# Patient Record
Sex: Female | Born: 1955 | Hispanic: No | Marital: Married | Smoking: Former smoker
Health system: Southern US, Community
[De-identification: ages and names within clinical notes are randomized; demographics above are authoritative.]

## PROBLEM LIST (undated history)

## (undated) DIAGNOSIS — L409 Psoriasis, unspecified: Secondary | ICD-10-CM

## (undated) DIAGNOSIS — E7800 Pure hypercholesterolemia, unspecified: Secondary | ICD-10-CM

## (undated) DIAGNOSIS — R7303 Prediabetes: Secondary | ICD-10-CM

## (undated) HISTORY — DX: Psoriasis, unspecified: L40.9

## (undated) HISTORY — DX: Prediabetes: R73.03

## (undated) HISTORY — PX: OTHER SURGICAL HISTORY: SHX169

## (undated) HISTORY — DX: Pure hypercholesterolemia, unspecified: E78.00

---

## 2010-05-02 ENCOUNTER — Other Ambulatory Visit
Admission: RE | Admit: 2010-05-02 | Discharge: 2010-05-02 | Payer: Self-pay | Source: Home / Self Care | Admitting: Obstetrics and Gynecology

## 2010-05-11 ENCOUNTER — Ambulatory Visit: Payer: Self-pay

## 2011-05-14 ENCOUNTER — Ambulatory Visit (INDEPENDENT_AMBULATORY_CARE_PROVIDER_SITE_OTHER): Payer: PRIVATE HEALTH INSURANCE | Admitting: Cardiology

## 2011-05-14 ENCOUNTER — Encounter: Payer: Self-pay | Admitting: Cardiology

## 2011-05-14 VITALS — BP 111/75 | HR 85 | Resp 16 | Ht 64.0 in | Wt 187.0 lb

## 2011-05-14 DIAGNOSIS — I6529 Occlusion and stenosis of unspecified carotid artery: Secondary | ICD-10-CM

## 2011-05-14 DIAGNOSIS — M79606 Pain in leg, unspecified: Secondary | ICD-10-CM

## 2011-05-14 DIAGNOSIS — R7303 Prediabetes: Secondary | ICD-10-CM | POA: Insufficient documentation

## 2011-05-14 DIAGNOSIS — R7309 Other abnormal glucose: Secondary | ICD-10-CM

## 2011-05-14 DIAGNOSIS — E7800 Pure hypercholesterolemia, unspecified: Secondary | ICD-10-CM | POA: Insufficient documentation

## 2011-05-14 DIAGNOSIS — M79609 Pain in unspecified limb: Secondary | ICD-10-CM

## 2011-05-14 MED ORDER — PRAVASTATIN SODIUM 20 MG PO TABS: 20.0000 mg | ORAL_TABLET | Freq: Every evening | ORAL | Status: DC

## 2011-05-14 NOTE — Assessment & Plan Note (Signed)
She follows with [facility] for follow up of this.  She needs risk reduction.

## 2011-05-14 NOTE — Patient Instructions (Signed)
Please start Pravastatin 20 mg a day Continue all other medications as listed  Your physician has requested that you have an ankle brachial index (ABI). During this test an ultrasound and blood pressure cuff are used to evaluate the arteries that supply the arms and legs with blood. Allow thirty minutes for this exam. There are no restrictions or special instructions.

## 2011-05-14 NOTE — Progress Notes (Signed)
HPI The patient presents as a new patient for evaluation of leg pain. She has actually been getting discomfort in her feet for some time. She did have ABIs about a year ago with the right being 0.94 in the left 0.83. Her only other cardiovascular workup has been a negative stress to her she reports about 10 years ago. I do note that she also has carotid artery stenosis with 50-69% bilateral stenosis last documented in 2012.  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 She says this will happen to her routinely when she walks three quarters of a mile. She says her feet feel heavy. She doesn't describe calf discomfort or thigh pain. She doesn't have chest pressure, neck or arm discomfort. She doesn't describe any palpitations, presyncope or syncope. She has no significant shortness of breath, PND or orthopnea.  Allergies  Allergen Reactions  . Beta Adrenergic Blockers   . Erythromycin   . Lipitor (Atorvastatin Calcium)   . Penicillins     Current Outpatient Prescriptions  Medication Sig Dispense Refill  . aspirin 81 MG tablet Take 81 mg by mouth daily.      . Cholecalciferol (VITAMIN D) 2000 UNITS tablet Take 2,000 Units by mouth daily.      . Omega-3 Fatty Acids (FISH OIL PO) Take by mouth daily.      . pravastatin (PRAVACHOL) 20 MG tablet Take 1 tablet (20 mg total) by mouth every evening.  90 tablet  3    Past Medical History  Diagnosis Date  . Psoriasis   . Hypercholesteremia   . Vitamin D deficiency   . Prediabetes     Past Surgical History  Procedure Date  . None     Family History  Problem Relation Age of Onset  . Osteoporosis Mother   . Hypertension Mother   . Hyperlipidemia Mother   . Kidney cancer Father   . Hypertension Father   . Hyperlipidemia Father   . Diabetes Brother   . Coronary artery disease Father 67    CABG (late 31s)    History   Social History  . Marital Status:  Married    Spouse Name: N/A    Number of Children: 2  . Years of Education: N/A   Occupational History  .     Social History Main Topics  . Smoking status: Former Smoker -- 1.0 packs/day for 35 years    Types: Cigarettes  . Smokeless tobacco: Not on file  . Alcohol Use: Yes  . Drug Use: No  . Sexually Active: Not on file   Other Topics Concern  . Not on file   Social History Narrative  . No narrative on file    ROS:  Positive dizziness, reflux. Otherwise as stated in the history of present illness and negative for all other systems.  PHYSICAL EXAM BP 111/75  Pulse 85  Resp 16  Ht 5\' 4"  (1.626 m)  Wt 84.823 kg (187 lb)  BMI 32.10 kg/m2 GENERAL:  Well appearing HEENT:  Pupils equal round and reactive, fundi not visualized, oral mucosa unremarkable NECK:  No jugular venous distention, waveform within normal limits, carotid upstroke brisk and symmetric, no bruits, no thyromegaly LYMPHATICS:  No cervical, inguinal adenopathy LUNGS:  Clear to auscultation bilaterally BACK:  No CVA tenderness CHEST:  Unremarkable HEART:  PMI not displaced or sustained,S1 and S2 within normal limits, no S3, no  S4, no clicks, no rubs, no murmurs ABD:  Flat, positive bowel sounds normal in frequency in pitch, no bruits, no rebound, no guarding, no midline pulsatile mass, no hepatomegaly, no splenomegaly EXT:  2 plus pulses upper, diminished pulses DP/PT bilateral and popliteal, no edema, no cyanosis no clubbing SKIN:  No rashes no nodules NEURO:  Cranial nerves II through XII grossly intact, motor grossly intact throughout [facility]:  Cognitively intact, oriented to person place and time  EKG:  Normal sinus rhythm, rate 85, axis within normal limits, intervals within normal limits, no acute ST-T wave changes 05/14/2011  ASSESSMENT AND PLAN

## 2011-05-14 NOTE — Assessment & Plan Note (Signed)
I am concerned that she has peripheral vascular disease. She clearly has risk factors with documented carotid stenosis. I will check ABIs and order that these be ambulatory as well. I will have a very low threshold for further evaluation such as lower extremity angiography. She needs aggressive risk reduction.

## 2011-05-14 NOTE — Assessment & Plan Note (Signed)
She has been intolerant of statins in the past. I have convinced her to try at least pravastatin 20 mg given a markedly elevated direct LDL of 218.

## 2011-06-12 ENCOUNTER — Encounter (INDEPENDENT_AMBULATORY_CARE_PROVIDER_SITE_OTHER): Payer: PRIVATE HEALTH INSURANCE

## 2011-06-12 DIAGNOSIS — I70219 Atherosclerosis of native arteries of extremities with intermittent claudication, unspecified extremity: Secondary | ICD-10-CM

## 2011-06-18 ENCOUNTER — Ambulatory Visit: Payer: Self-pay | Admitting: Family Medicine

## 2011-06-18 ENCOUNTER — Ambulatory Visit (INDEPENDENT_AMBULATORY_CARE_PROVIDER_SITE_OTHER): Payer: PRIVATE HEALTH INSURANCE | Admitting: Cardiology

## 2011-06-18 ENCOUNTER — Encounter: Payer: Self-pay | Admitting: Cardiology

## 2011-06-18 DIAGNOSIS — E7800 Pure hypercholesterolemia, unspecified: Secondary | ICD-10-CM

## 2011-06-18 DIAGNOSIS — M79606 Pain in leg, unspecified: Secondary | ICD-10-CM

## 2011-06-18 DIAGNOSIS — I739 Peripheral vascular disease, unspecified: Secondary | ICD-10-CM

## 2011-06-18 DIAGNOSIS — M79609 Pain in unspecified limb: Secondary | ICD-10-CM

## 2011-06-18 DIAGNOSIS — I6529 Occlusion and stenosis of unspecified carotid artery: Secondary | ICD-10-CM

## 2011-06-18 NOTE — Assessment & Plan Note (Signed)
She agreed to try pravastatin at the last appointment. Unfortunately she's getting some muscle aches. She does agree to try for Q10 to see if this will help before abandoning statins. She has changed her diet significantly and is thinking of becoming a Vegan.

## 2011-06-18 NOTE — Patient Instructions (Signed)
Your physician has requested that you have a carotid duplex. This test is an ultrasound of the carotid arteries in your neck. It looks at blood flow through these arteries that supply the brain with blood. Allow one hour for this exam. There are no restrictions or special instructions.  You are being scheduled to see Dr Excell Seltzer for leg pain.  Continue current medications as listed.

## 2011-06-18 NOTE — Progress Notes (Signed)
   HPI The patient presents  for evaluation of leg pain. She has actually been getting discomfort in her feet for some time. She did have ABIs about a year ago with the right being 0.94 in the left 0.83. Her only other cardiovascular workup has been a negative stress to her she reports about 10 years ago. I do note that she also has carotid artery stenosis with 50-69% bilateral stenosis last documented in 2012.  I sent her for ambulatory ABIs which demonstrated a drop into the moderate range with ambulation.  Her symptoms are reported on the previous note. She describes ankle discomfort after walking about three quarters of a mile. This is limiting her quality-of-life and significantly aggravating to her. She denies any chest pressure, neck or arm discomfort. She denies any palpitations, presyncope or syncope.  Allergies  Allergen Reactions  . Beta Adrenergic Blockers   . Erythromycin   . Lipitor (Atorvastatin Calcium)   . Penicillins     Current Outpatient Prescriptions  Medication Sig Dispense Refill  . aspirin 81 MG tablet Take 81 mg by mouth daily.      . Cholecalciferol (VITAMIN D) 2000 UNITS tablet Take 2,000 Units by mouth daily.      . Omega-3 Fatty Acids (FISH OIL PO) Take by mouth daily.      . pravastatin (PRAVACHOL) 20 MG tablet Take 1 tablet (20 mg total) by mouth every evening.  90 tablet  3    Past Medical History  Diagnosis Date  . Psoriasis   . Hypercholesteremia   . Vitamin d deficiency   . Prediabetes     Past Surgical History  Procedure Date  . None     ROS:  Positive dizziness, reflux. Otherwise as stated in the history of present illness and negative for all other systems.  PHYSICAL EXAM BP 138/74  Pulse 66  Ht 5\' 4"  (1.626 m)  Wt 187 lb (84.823 kg)  BMI 32.10 kg/m2 GENERAL:  Well appearing NECK:  No jugular venous distention, waveform within normal limits, carotid upstroke brisk and symmetric, no bruits, no thyromegaly LUNGS:  Clear to auscultation  bilaterally BACK:  No CVA tenderness CHEST:  Unremarkable HEART:  PMI not displaced or sustained,S1 and S2 within normal limits, no S3, no S4, no clicks, no rubs, no murmurs ABD:  Flat, positive bowel sounds normal in frequency in pitch, no bruits, no rebound, no guarding, no midline pulsatile mass, no hepatomegaly, no splenomegaly EXT:  2 plus pulses upper, diminished pulses DP/PT bilateral and popliteal, no edema, no cyanosis no clubbing  ASSESSMENT AND PLAN

## 2011-06-18 NOTE — Assessment & Plan Note (Signed)
She is due for followup carotid Dopplers. I will arrange this.

## 2011-06-18 NOTE — Assessment & Plan Note (Signed)
I have discussed her case with Dr. Excell Seltzer. He will see her for the next available appointment to evaluate her peripheral vascular disease.

## 2011-06-21 ENCOUNTER — Ambulatory Visit: Payer: Self-pay | Admitting: Family Medicine

## 2011-06-26 ENCOUNTER — Encounter (INDEPENDENT_AMBULATORY_CARE_PROVIDER_SITE_OTHER): Payer: PRIVATE HEALTH INSURANCE

## 2011-06-26 DIAGNOSIS — I6529 Occlusion and stenosis of unspecified carotid artery: Secondary | ICD-10-CM

## 2011-06-26 DIAGNOSIS — R42 Dizziness and giddiness: Secondary | ICD-10-CM

## 2011-06-28 ENCOUNTER — Telehealth: Payer: Self-pay | Admitting: Cardiology

## 2011-06-28 NOTE — Telephone Encounter (Signed)
FU Call: Pt returning call to our office regarding pt recent test results. Please return pt call to discuss further.

## 2011-06-28 NOTE — Telephone Encounter (Signed)
Reviewed results of testing with pt who states understanding. 

## 2011-07-17 ENCOUNTER — Encounter: Payer: Self-pay | Admitting: Cardiovascular Disease

## 2011-07-17 ENCOUNTER — Ambulatory Visit (INDEPENDENT_AMBULATORY_CARE_PROVIDER_SITE_OTHER): Payer: PRIVATE HEALTH INSURANCE | Admitting: Cardiovascular Disease

## 2011-07-17 VITALS — BP 128/80 | HR 68 | Ht 64.0 in | Wt 191.8 lb

## 2011-07-17 DIAGNOSIS — I70219 Atherosclerosis of native arteries of extremities with intermittent claudication, unspecified extremity: Secondary | ICD-10-CM

## 2011-07-17 MED ORDER — CILOSTAZOL 100 MG PO TABS: 100.0000 mg | ORAL_TABLET | Freq: Two times a day (BID) | ORAL | Status: DC

## 2011-07-17 NOTE — Patient Instructions (Addendum)
Non-Cardiac CT Angiography (CTA), is a special type of CT scan that uses a computer to produce multi-dimensional views of major blood vessels throughout the body. In CT angiography, a contrast material is injected through an IV to help visualize the blood vessels (CTA of Abdominal Aorta with run off)  Your physician has recommended you make the following change in your medication: START Pletal 100mg  take one-half tablet by mouth twice a day for one week and then increase to one tablet by mouth twice a day  Your physician recommends that you schedule a follow-up appointment as needed.   Your physician recommends that you have lab work today: BMP

## 2011-07-18 LAB — BASIC METABOLIC PANEL
BUN: 18 mg/dL (ref 6–23)
Chloride: 106 mEq/L (ref 96–112)
Creatinine, Ser: 1.1 mg/dL (ref 0.4–1.2)
GFR: 56.06 mL/min — ABNORMAL LOW (ref >60.00)
Glucose, Bld: 108 mg/dL — ABNORMAL HIGH (ref 70–99)
Potassium: 4.1 mEq/L (ref 3.5–5.1)

## 2011-07-19 ENCOUNTER — Other Ambulatory Visit: Payer: PRIVATE HEALTH INSURANCE

## 2011-07-19 ENCOUNTER — Encounter: Payer: Self-pay | Admitting: Cardiovascular Disease

## 2011-07-19 DIAGNOSIS — I70219 Atherosclerosis of native arteries of extremities with intermittent claudication, unspecified extremity: Secondary | ICD-10-CM | POA: Insufficient documentation

## 2011-07-19 NOTE — Assessment & Plan Note (Addendum)
The patient has classic symptoms of bilateral calf claudication with only mildly abnormal resting ABIs. However, her exercise ABIs show a significant drop post exercise to 51% on the right and 65% on the left. These findings are fairly typical for patients who have aortoiliac occlusive disease. These are high resistance vessels and rest ABIs can't be either normal or near normal even in the presence of significant disease. I have recommended an aortoiliac CTA with runoff to evaluate the patient's arterial anatomy and to rule out aortoiliac disease.  Regarding her cardiovascular risk reduction, the patient is on daily aspirin. She quit smoking last year and I reinforced the importance of continued tobacco cessation specifically as it relates to her PAD and risk of stroke and myocardial infarction. She has been tried on multiple statin drugs and is intolerant due to generalized muscle weakness. I am going to give her a trial of Pletal titrated up to 100 mg twice daily. Potential side effects were reviewed with the patient.  We'll determine followup based on her CTA results.

## 2011-07-19 NOTE — Progress Notes (Signed)
HPI:  This is a 56 year old woman referred for evaluation of lower terminate peripheral arterial disease. The patient complains of slowly progressive and long-standing leg heaviness and weakness with ambulation. Her symptoms are mostly in the calves and ankles bilaterally and both legs are affected equally. This has been ongoing for several years but continues to worsen. The patient is unable to walk up a hill. She can walk on flat ground for a moderate distance, but has to stop and rest frequently. She denies rest pain and has no history of ulceration. The patient has no history of MI or stroke. She does have known carotid stenosis.  Recent ABIs were 84% on the right an 85% on the left. With exercise she dropped to 51% on the right and 65% on the left immediately post exercise and recovered to baseline at 6 minutes.  Her last carotid duplex was 06/26/2011 and this showed 60-79% stenosis on the right and less than 40% stenosis on the left. 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  Outpatient Encounter Prescriptions as of 07/17/2011  Medication Sig Dispense Refill  . aspirin 81 MG tablet Take 81 mg by mouth daily.      . calcium carbonate 200 MG capsule Take 500 mg by mouth 2 (two) times daily with a meal.      . Cholecalciferol (VITAMIN D) 2000 UNITS tablet Take 2,000 Units by mouth daily.      . Omega-3 Fatty Acids (FISH OIL PO) Take by mouth daily.      . cilostazol (PLETAL) 100 MG tablet Take 1 tablet (100 mg total) by mouth 2 (two) times daily.  180 tablet  3  . DISCONTD: pravastatin (PRAVACHOL) 20 MG tablet Take 1 tablet (20 mg total) by mouth every evening.  90 tablet  3    Beta adrenergic blockers; Erythromycin; Lipitor; and Penicillins  Past Medical History  Diagnosis Date  . Psoriasis   . Hypercholesteremia   . Vitamin d deficiency   . Prediabetes     Past Surgical History  Procedure Date  . None     History   Social  History  . Marital Status: Married    Spouse Name: N/A    Number of Children: 2  . Years of Education: N/A   Occupational History  .     Social History Main Topics  . Smoking status: Former Smoker -- 1.0 packs/day for 35 years    Types: Cigarettes    Quit date: 09/18/2010  . Smokeless tobacco: Not on file  . Alcohol Use: Yes  . Drug Use: No  . Sexually Active: Not on file   Other Topics Concern  . Not on file   Social History Narrative  . No narrative on file    Family History  Problem Relation Age of Onset  . Osteoporosis Mother   . Hypertension Mother   . Hyperlipidemia Mother   . Kidney cancer Father   . Hypertension Father   . Hyperlipidemia Father   . Diabetes Brother   . Coronary artery disease Father 58    CABG (late 65s)    ROS: General: no fevers/chills/night sweats Eyes: no blurry vision, diplopia, or amaurosis ENT: no sore throat or hearing loss Resp: no cough, wheezing, or hemoptysis CV: no edema or palpitations GI: no abdominal pain, nausea, vomiting, diarrhea, or constipation GU: no dysuria, frequency, or hematuria Skin: no rash Neuro: no headache, numbness, tingling, or weakness of extremities Musculoskeletal: no joint  pain or swelling Heme: no bleeding, DVT, or easy bruising Endo: no polydipsia or polyuria  BP 128/80  Pulse 68  Ht 5\' 4"  (1.626 m)  Wt 87 kg (191 lb 12.8 oz)  BMI 32.92 kg/m2  PHYSICAL EXAM: Pt is alert and oriented, WD, WN, in no distress. HEENT: normal Neck: JVP normal. Carotid upstrokes normal with a right carotid bruit. No thyromegaly. Lungs: equal expansion, clear bilaterally CV: Apex is discrete and nondisplaced, RRR without murmur or gallop Abd: soft, NT, +BS, no bruit, no hepatosplenomegaly Back: no CVA tenderness Ext: no C/C/E        Femoral pulses 2+= without bruits        DP/PT pulses are 1+= bilaterally Skin: warm and dry without rash Neuro: CNII-XII intact             Strength intact =  bilaterally  ASSESSMENT AND PLAN:

## 2011-07-23 ENCOUNTER — Other Ambulatory Visit: Payer: PRIVATE HEALTH INSURANCE

## 2011-07-30 ENCOUNTER — Telehealth: Payer: Self-pay | Admitting: Cardiovascular Disease

## 2011-07-30 NOTE — Telephone Encounter (Signed)
I spoke with the pt and she took Pletal and developed a rapid pulse and SOB.  This scared the patient so she stopped pletal.  I made the pt aware that this is a side effect of the medication.  The pt did start at lower dose as directed and was made aware of possible side effects at office visit.  I instructed the pt to remain off of this medication.  We are currently trying to get the pt's CTA approved through insurance.  The pt also is having myalgias from Pravastatin.  The pt tried this medication with CoQ10 and still had side effects.  The pt has tried multiple statin drugs and had side effects.  At this time the pt has stopped Pravastatin.  I told the pt that I would forward this information to Dr Antoine Poche to review and make further recommendations about cholesterol management.

## 2011-07-30 NOTE — Telephone Encounter (Signed)
Patient request return call from nurse regarding medication issues,  she can be reached at [phone]  Dr. Antoine Poche- P. Flemming   Patient was prescribed Pravastatin Med by Dr. Antoine Poche, seems to be experiencing side affects (Myalgia)   Dr. Excell Seltzer - L. Brown   Patient was prescribed Pletal Med by Dr. Excell Seltzer, experiencing heart pounding and shortness of breath.   Patient request message be sent to both nurse for review and return call

## 2011-07-30 NOTE — Telephone Encounter (Signed)
Reviewed. The patient can remain off Pletal. I discussed the CT angiogram with a physician with the patient's insurance company and the procedure has been approved.

## 2011-07-30 NOTE — Telephone Encounter (Signed)
The patient can remain off of the statin and continue the fish oil.

## 2011-07-31 NOTE — Telephone Encounter (Signed)
Left message for pt to call back and discuss responses from Dr Excell Seltzer and Dr Antoine Poche.

## 2011-07-31 NOTE — Telephone Encounter (Signed)
I spoke with the pt and made her aware of Dr Excell Seltzer and Dr Hochrein's comments.  The pt plans on calling back and scheduling CTA once she has her schedule.

## 2011-08-22 ENCOUNTER — Ambulatory Visit (INDEPENDENT_AMBULATORY_CARE_PROVIDER_SITE_OTHER)
Admission: RE | Admit: 2011-08-22 | Discharge: 2011-08-22 | Disposition: A | Discharge status: Home / Self Care | Payer: PRIVATE HEALTH INSURANCE | Source: Ambulatory Visit | Attending: Cardiovascular Disease | Admitting: Cardiovascular Disease

## 2011-08-22 DIAGNOSIS — I70219 Atherosclerosis of native arteries of extremities with intermittent claudication, unspecified extremity: Secondary | ICD-10-CM

## 2011-08-22 MED ORDER — IOHEXOL 350 MG/ML SOLN
120.0000 mL | Freq: Once | INTRAVENOUS | Status: AC | PRN
  Administered 2011-08-22: 120 mL via INTRAVENOUS

## 2011-10-08 ENCOUNTER — Ambulatory Visit: Payer: PRIVATE HEALTH INSURANCE | Admitting: Cardiology

## 2014-06-30 ENCOUNTER — Telehealth: Payer: Self-pay | Admitting: *Deleted

## 2014-06-30 DIAGNOSIS — B379 Candidiasis, unspecified: Secondary | ICD-10-CM

## 2014-06-30 MED ORDER — METRONIDAZOLE 0.75 % VA GEL: 1.0000 | Freq: Every day | VAGINAL | Status: AC

## 2014-06-30 NOTE — Telephone Encounter (Signed)
Pt called in wanting Metrogel - Spoke to [employer]
